# Patient Record
Sex: Male | Born: 1945
Health system: Southern US, Community
[De-identification: ages and names within clinical notes are randomized; demographics above are authoritative.]

---

## 2005-07-26 ENCOUNTER — Inpatient Hospital Stay (HOSPITAL_COMMUNITY): Admission: AD | Admit: 2005-07-26 | Discharge: 2005-08-03 | Payer: Self-pay | Admitting: Internal Medicine

## 2005-07-26 ENCOUNTER — Ambulatory Visit: Payer: Self-pay | Admitting: Internal Medicine

## 2005-09-21 ENCOUNTER — Encounter
Admission: RE | Admit: 2005-09-21 | Discharge: 2005-09-21 | Payer: Self-pay | Admitting: Thoracic Surgery (Cardiothoracic Vascular Surgery)

## 2007-02-11 IMAGING — CT CT CHEST W/O CM
1 of 3 series · 15 of 33 positions shown, 19 images · IV contrast (agent unspecified)
Comparison: none

CLINICAL DATA: Right subcarinal calcified density.  Patient for open heart surgery for unstable angina.  
 CHEST CT WITHOUT CONTRAST:
TECHNIQUE: Multidetector CT imaging of the chest was performed following the standard protocol without IV contrast.

[Series 2: routine chest w/o cm · axial · non-contrast · 0.86mm/px · z∈[-169,+121]mm · 15 of 64 slices shown, 19 images]
[im 3/64  mediastinal]
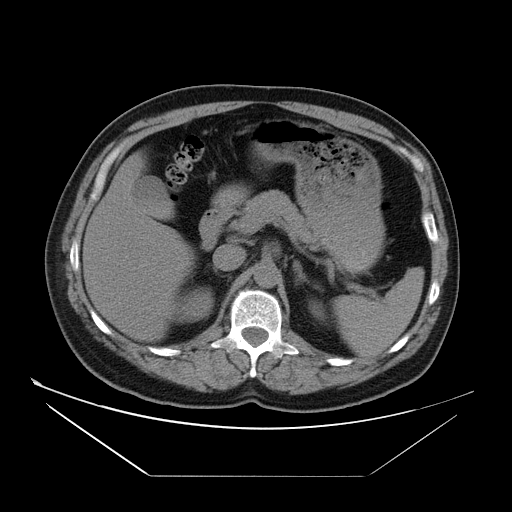
[im 3/64  lung]
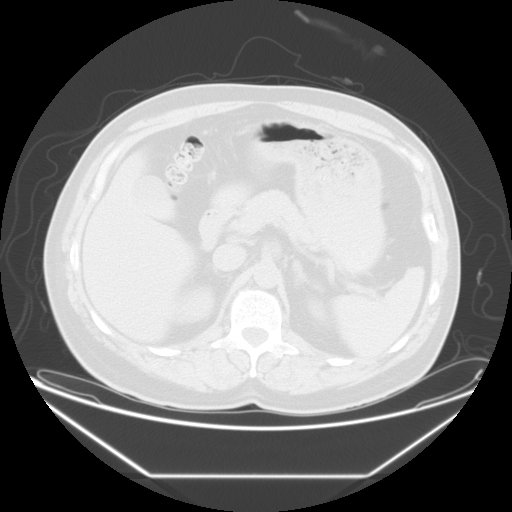
[im 8/64  lung]
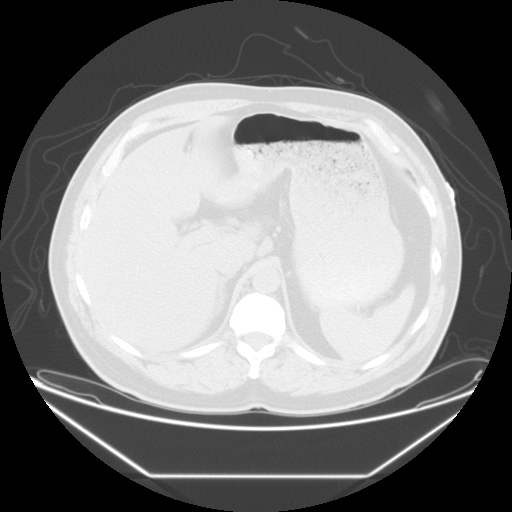
[im 12/64  lung]
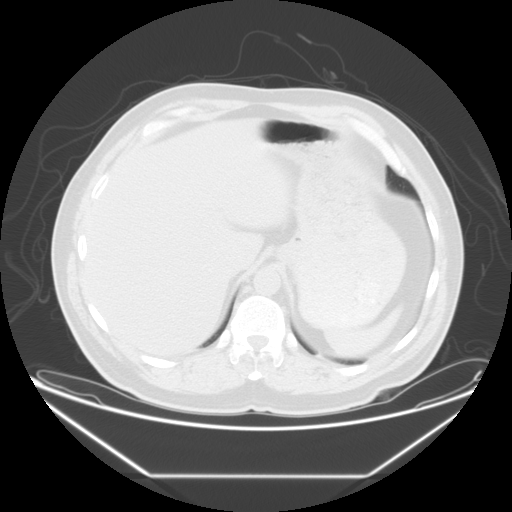
[im 17/64  lung]
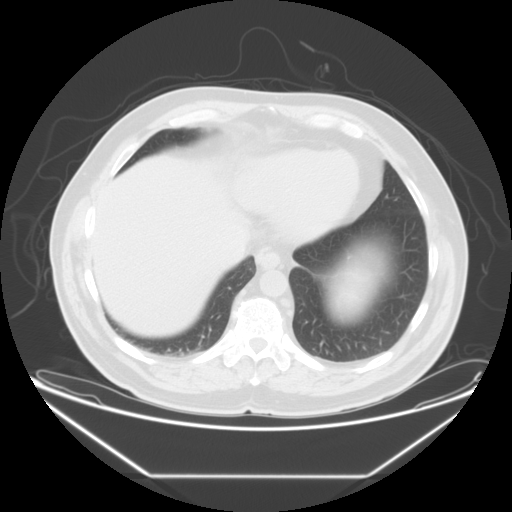
[im 19/64  mediastinal]
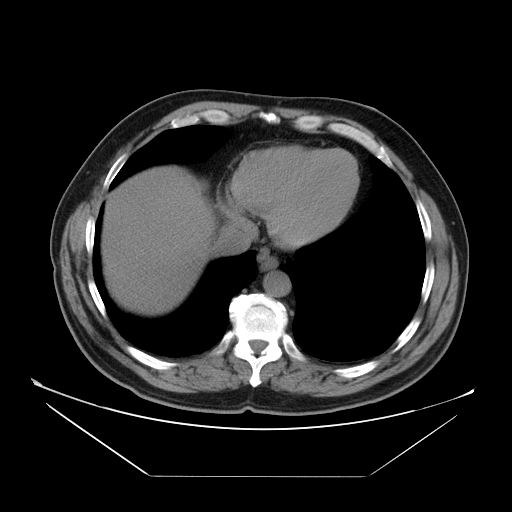
[im 19/64  lung]
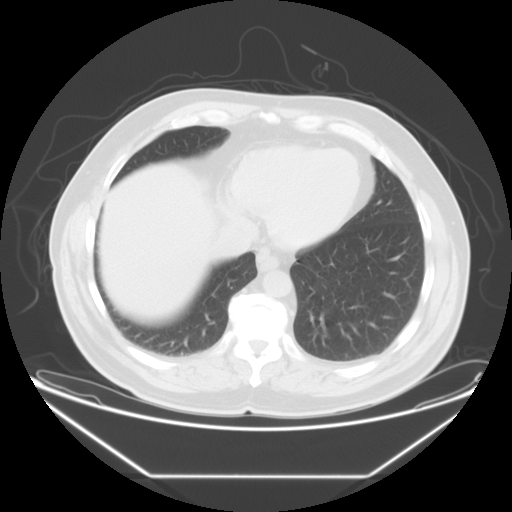
[im 24/64  lung]
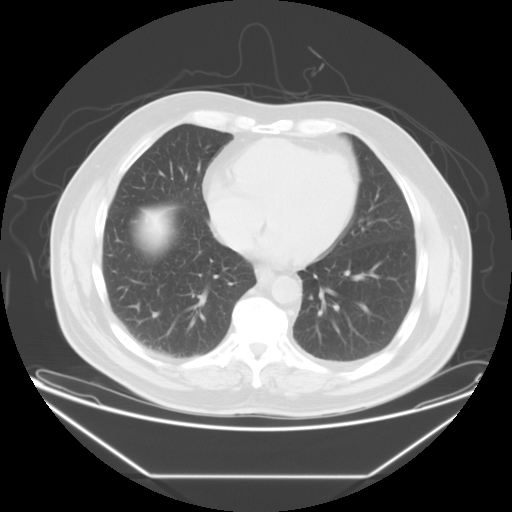
[im 29/64  lung]
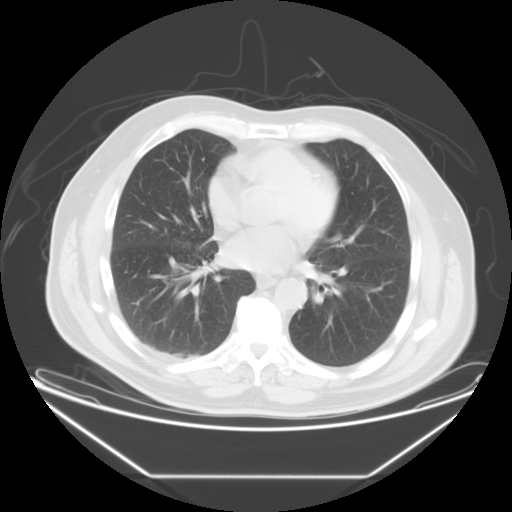
[im 33/64  lung]
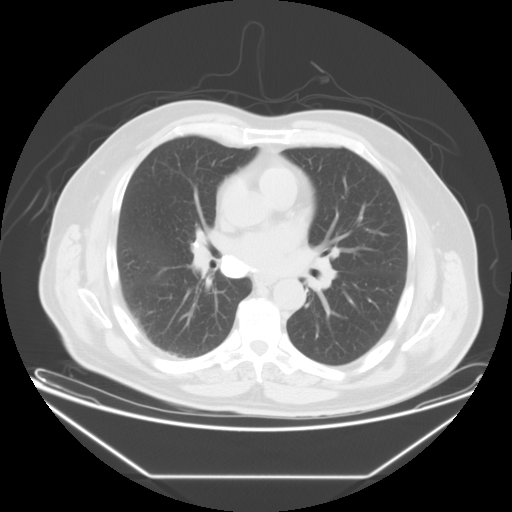
[im 36/64  mediastinal]
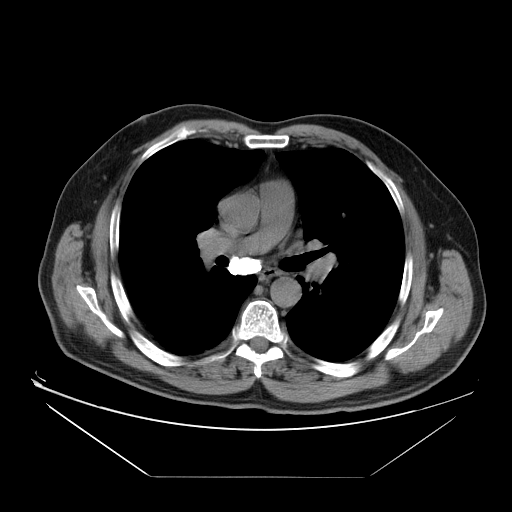
[im 36/64  lung]
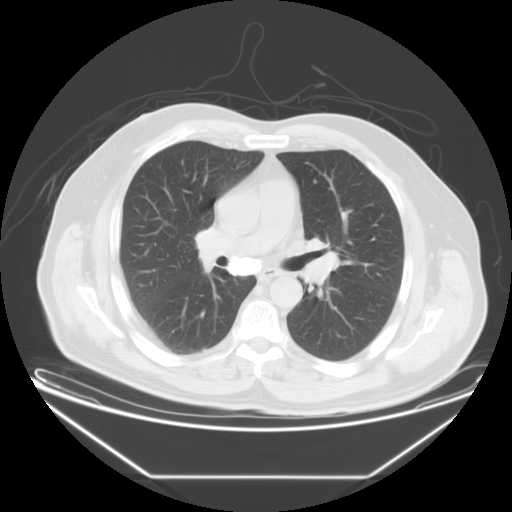
[im 40/64  lung]
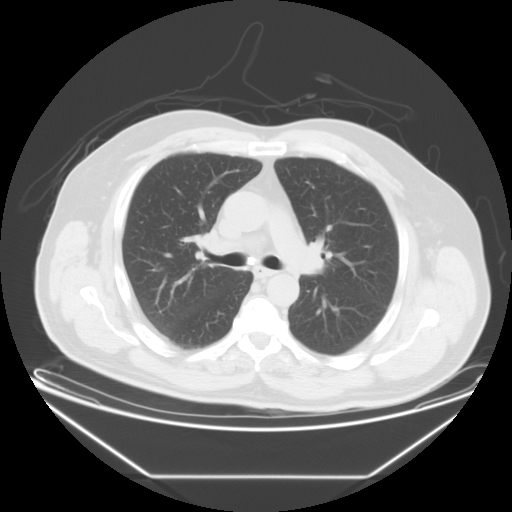
[im 45/64  lung]
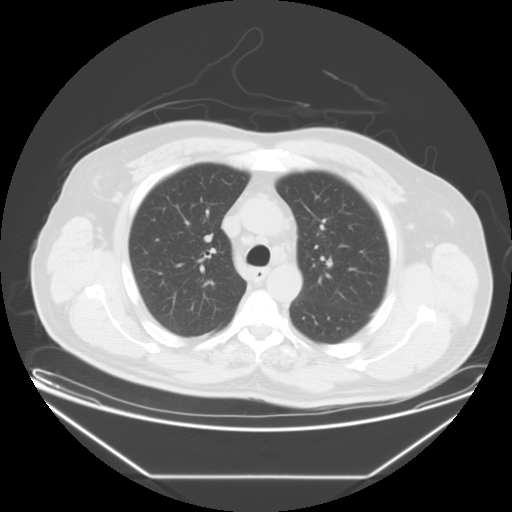
[im 47/64  lung]
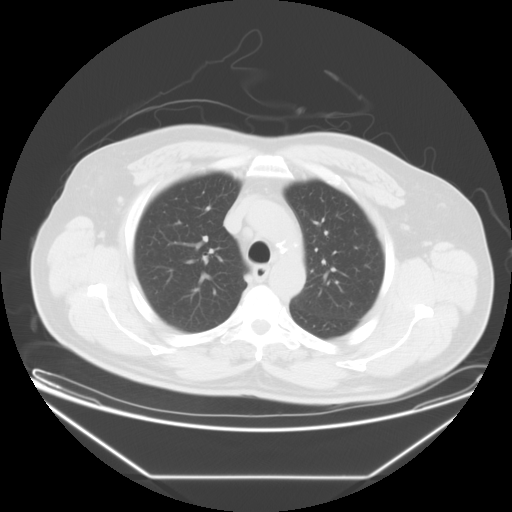
[im 52/64  mediastinal]
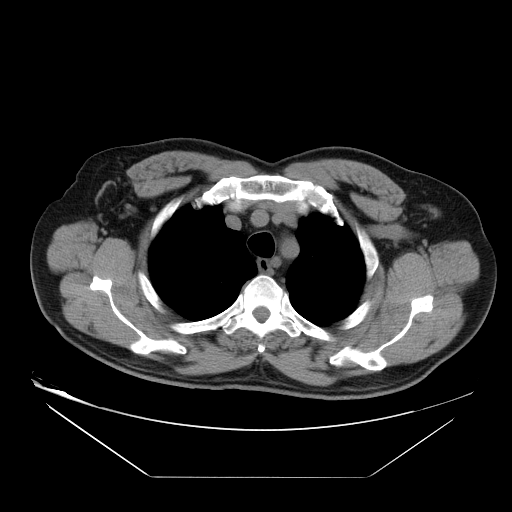
[im 52/64  lung]
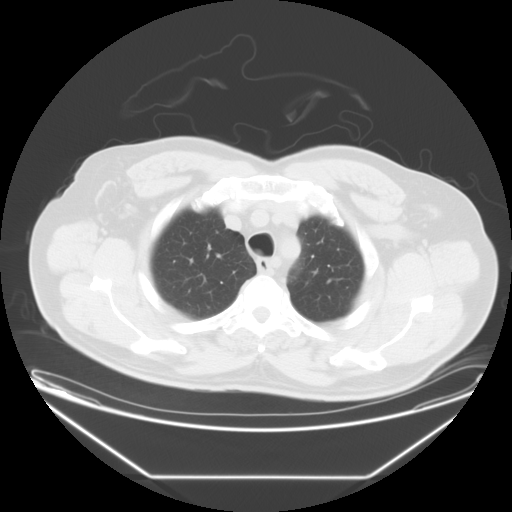
[im 57/64  lung]
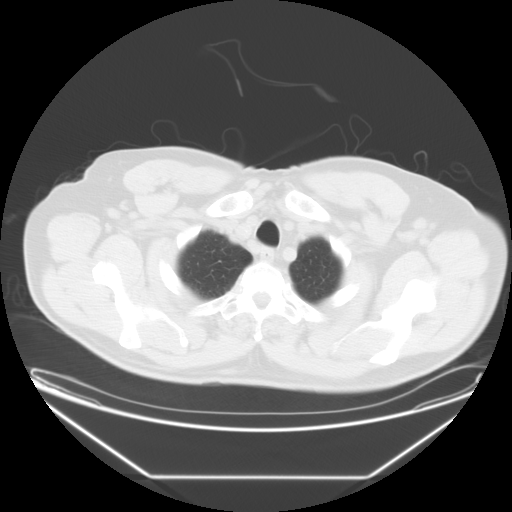
[im 61/64  lung]
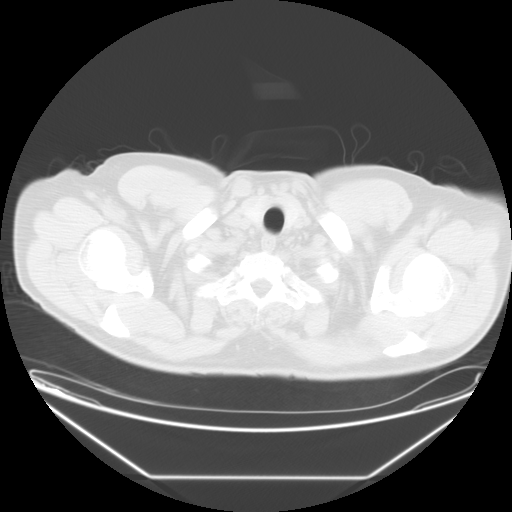

[15 of 33 positions shown; findings below may reference images not displayed]

FINDINGS: A series of scans of the entire chest are made without intravenous contrast and show a large group of calcified nodes in the right subcarinal region which measure up to 3.3 x 1.7 x estimated 5 cm and appear to be old calcified granulomata.  There are a few other very small calcifications in the mediastinal area.  No active pulmonary disease is identified.  Tracheobronchial tree is not compromised or narrowed.  The heart is normal in size but shows considerable coronary artery calcification.  There is no pericardial or pleural effusion.  No evidence of neoplastic mass is seen.  The adrenal glands and that portion of the liver that is visualized appear normal.  Bony thorax shows some anterior degenerative hypertrophic spurs but no fracture or metastatic disease.
IMPRESSION: Group of calcified nodes right subcarinal area.  No acute pulmonary disease is identified.  No evidence of neoplasm is seen.  Coronary arteries do show moderate calcification.

## 2007-02-16 IMAGING — CR DG CHEST 2V
2 series · 2 of 2 positions shown · non-contrast
Comparison: 08/01/05

CLINICAL DATA: Status post CABG procedure.  
 ESCNW-U VIEWS:

[w chest pa]
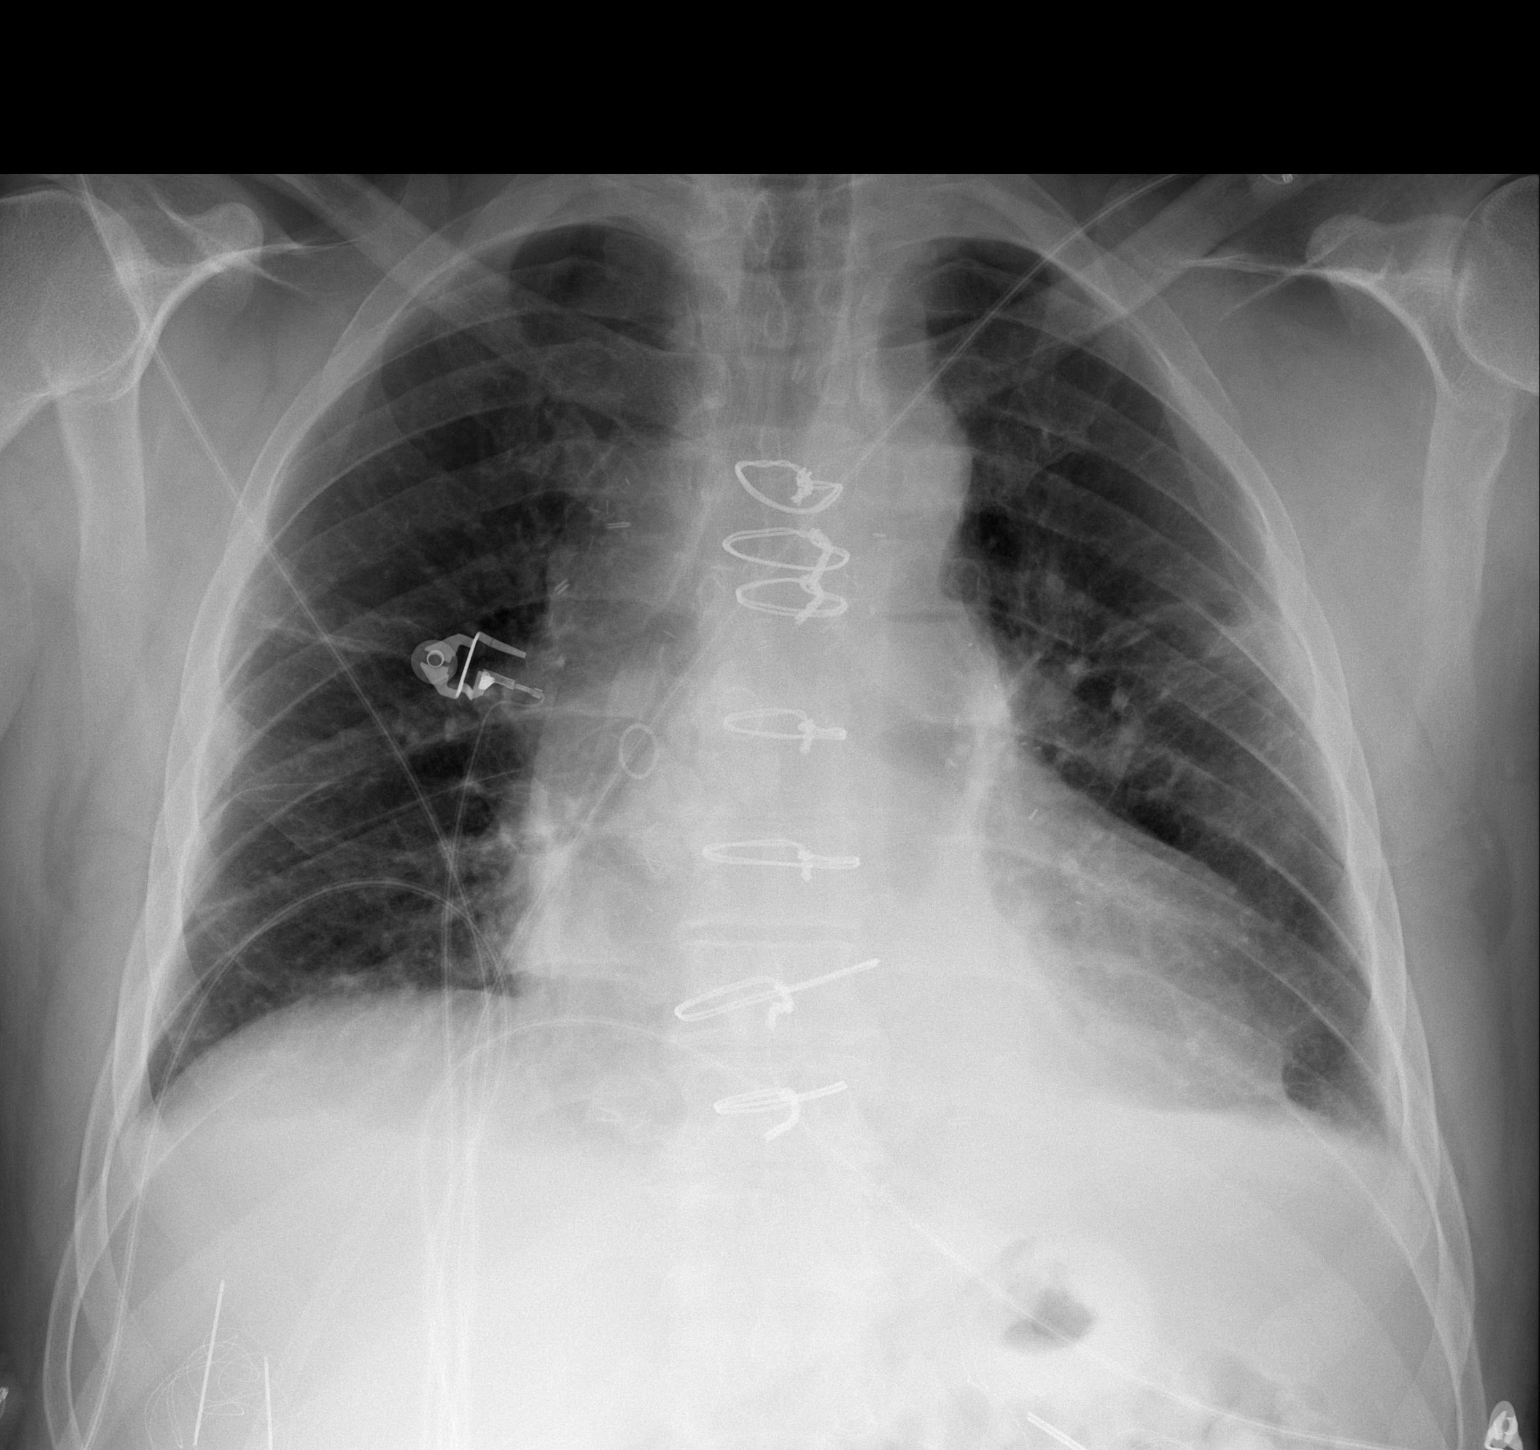

[w chest lat]
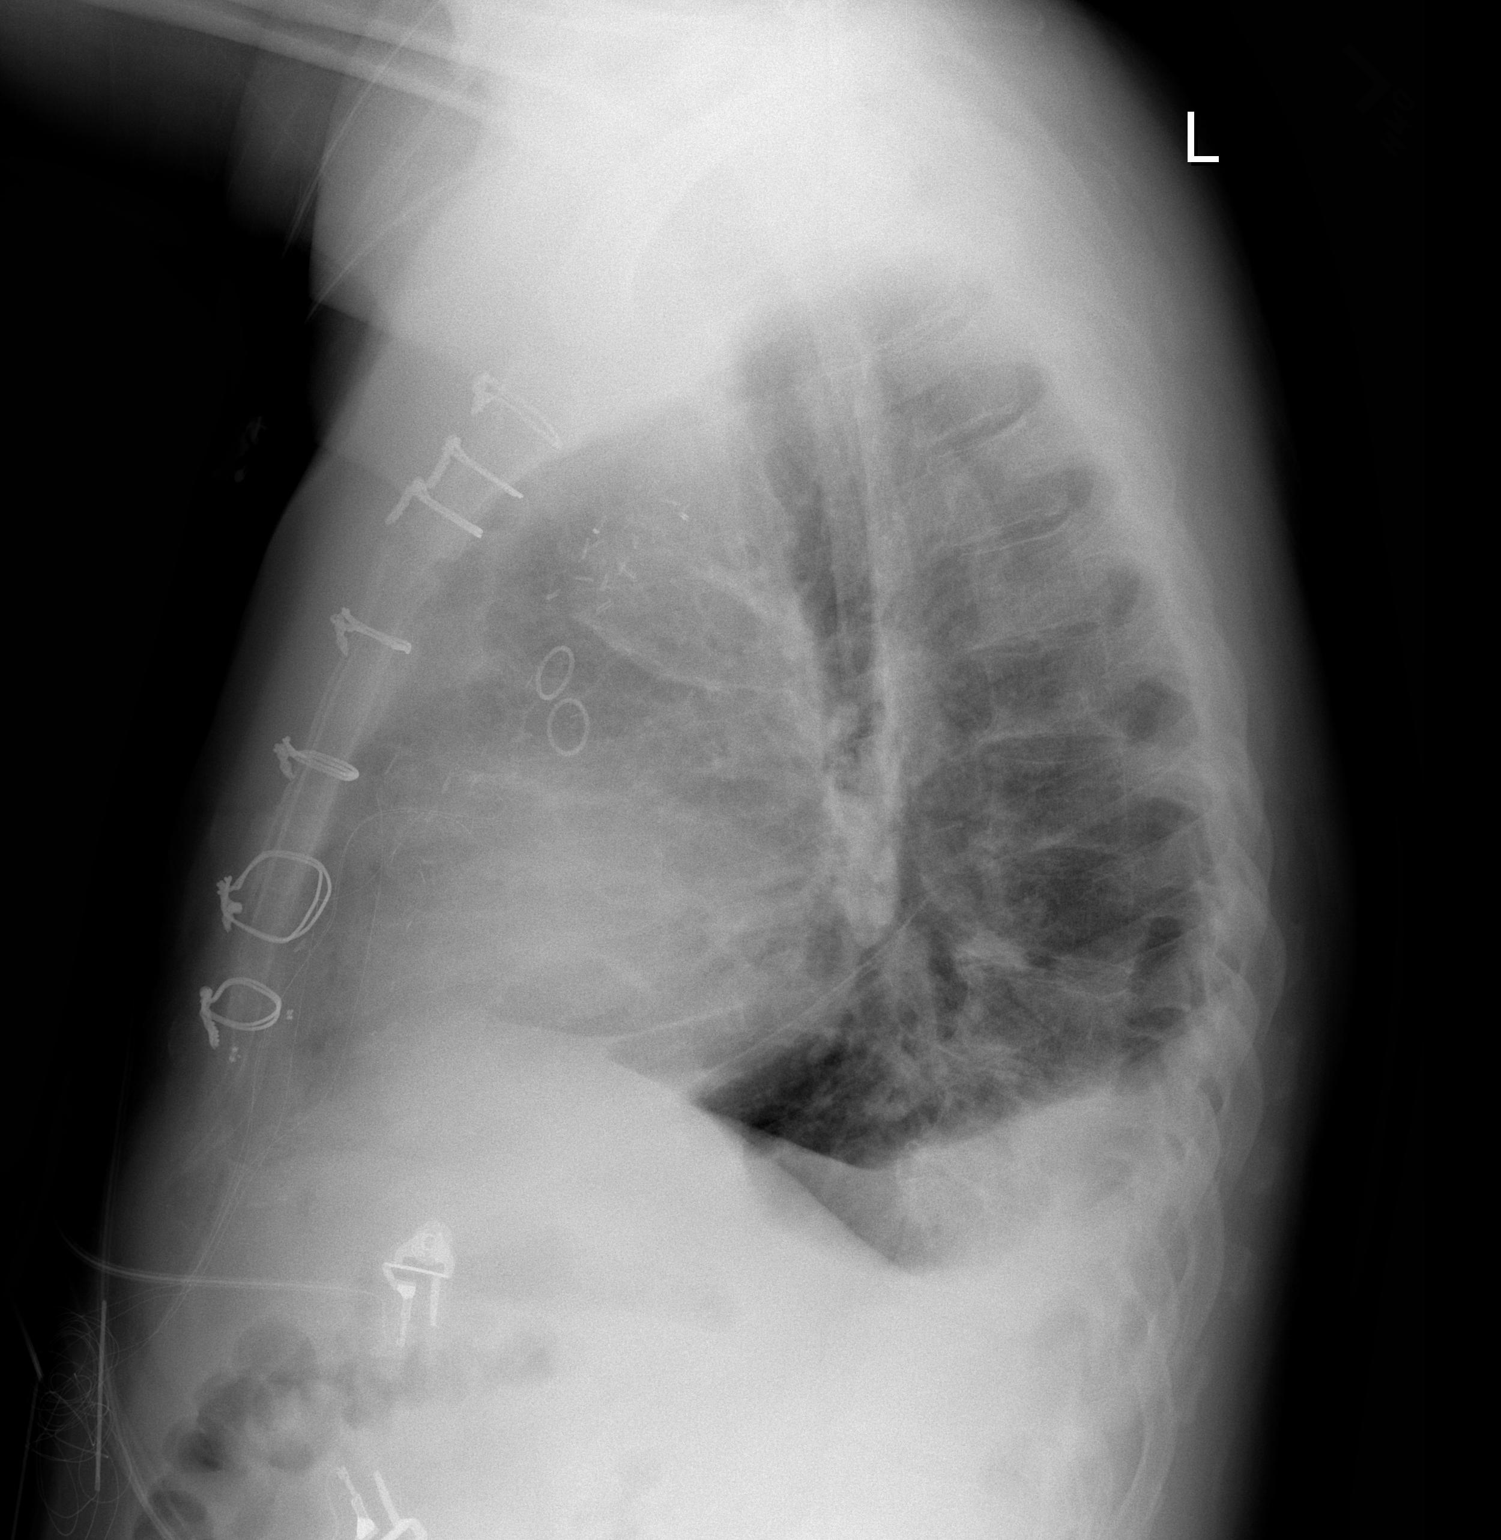

[2 of 2 positions shown; findings below may reference images not displayed]

FINDINGS: Interval removal of right IJ catheter.  Scattered areas of linear atelectasis seen bilaterally.  Bilateral pleural effusions left greater than right.  No interstitial edema.  There is cardiomegaly.
IMPRESSION: 1.  Cardiomegaly with bilateral effusions left greater than right.
 2.  Scattered areas of linear atelectasis.

## 2015-03-01 DIAGNOSIS — Z79899 Other long term (current) drug therapy: Secondary | ICD-10-CM | POA: Insufficient documentation

## 2015-03-04 DIAGNOSIS — E782 Mixed hyperlipidemia: Secondary | ICD-10-CM | POA: Diagnosis not present

## 2015-03-04 DIAGNOSIS — I10 Essential (primary) hypertension: Secondary | ICD-10-CM | POA: Diagnosis not present

## 2015-03-04 DIAGNOSIS — I2510 Atherosclerotic heart disease of native coronary artery without angina pectoris: Secondary | ICD-10-CM | POA: Diagnosis not present

## 2015-03-04 DIAGNOSIS — Z951 Presence of aortocoronary bypass graft: Secondary | ICD-10-CM | POA: Diagnosis not present

## 2015-03-05 DIAGNOSIS — Z23 Encounter for immunization: Secondary | ICD-10-CM | POA: Diagnosis not present

## 2015-07-26 DIAGNOSIS — H66009 Acute suppurative otitis media without spontaneous rupture of ear drum, unspecified ear: Secondary | ICD-10-CM | POA: Diagnosis not present

## 2015-11-08 DIAGNOSIS — H40013 Open angle with borderline findings, low risk, bilateral: Secondary | ICD-10-CM | POA: Diagnosis not present

## 2015-11-08 DIAGNOSIS — H25811 Combined forms of age-related cataract, right eye: Secondary | ICD-10-CM | POA: Diagnosis not present

## 2016-01-07 DIAGNOSIS — Z7982 Long term (current) use of aspirin: Secondary | ICD-10-CM | POA: Diagnosis not present

## 2016-01-07 DIAGNOSIS — E785 Hyperlipidemia, unspecified: Secondary | ICD-10-CM | POA: Diagnosis not present

## 2016-01-07 DIAGNOSIS — I10 Essential (primary) hypertension: Secondary | ICD-10-CM | POA: Diagnosis not present

## 2016-01-07 DIAGNOSIS — Z79899 Other long term (current) drug therapy: Secondary | ICD-10-CM | POA: Diagnosis not present

## 2016-01-07 DIAGNOSIS — Z951 Presence of aortocoronary bypass graft: Secondary | ICD-10-CM | POA: Diagnosis not present

## 2016-01-07 DIAGNOSIS — Z87891 Personal history of nicotine dependence: Secondary | ICD-10-CM | POA: Diagnosis not present

## 2016-01-07 DIAGNOSIS — I2510 Atherosclerotic heart disease of native coronary artery without angina pectoris: Secondary | ICD-10-CM | POA: Diagnosis not present

## 2016-01-07 DIAGNOSIS — H25811 Combined forms of age-related cataract, right eye: Secondary | ICD-10-CM | POA: Diagnosis not present

## 2016-01-07 DIAGNOSIS — I252 Old myocardial infarction: Secondary | ICD-10-CM | POA: Diagnosis not present

## 2016-02-11 DIAGNOSIS — E7800 Pure hypercholesterolemia, unspecified: Secondary | ICD-10-CM | POA: Diagnosis not present

## 2016-02-11 DIAGNOSIS — Z0000 Encounter for general adult medical examination without abnormal findings: Secondary | ICD-10-CM | POA: Diagnosis not present

## 2016-02-11 DIAGNOSIS — I10 Essential (primary) hypertension: Secondary | ICD-10-CM | POA: Diagnosis not present

## 2016-02-20 DIAGNOSIS — M545 Low back pain: Secondary | ICD-10-CM | POA: Diagnosis not present

## 2016-02-25 DIAGNOSIS — M1712 Unilateral primary osteoarthritis, left knee: Secondary | ICD-10-CM | POA: Diagnosis not present

## 2016-02-26 DIAGNOSIS — R69 Illness, unspecified: Secondary | ICD-10-CM | POA: Diagnosis not present

## 2016-02-28 DIAGNOSIS — M19012 Primary osteoarthritis, left shoulder: Secondary | ICD-10-CM | POA: Diagnosis not present

## 2016-03-06 DIAGNOSIS — E782 Mixed hyperlipidemia: Secondary | ICD-10-CM | POA: Diagnosis not present

## 2016-03-06 DIAGNOSIS — Z951 Presence of aortocoronary bypass graft: Secondary | ICD-10-CM | POA: Diagnosis not present

## 2016-03-06 DIAGNOSIS — I10 Essential (primary) hypertension: Secondary | ICD-10-CM | POA: Diagnosis not present

## 2016-03-06 DIAGNOSIS — I2510 Atherosclerotic heart disease of native coronary artery without angina pectoris: Secondary | ICD-10-CM | POA: Diagnosis not present

## 2016-03-31 DIAGNOSIS — Z1211 Encounter for screening for malignant neoplasm of colon: Secondary | ICD-10-CM | POA: Diagnosis not present

## 2016-04-08 DIAGNOSIS — G609 Hereditary and idiopathic neuropathy, unspecified: Secondary | ICD-10-CM | POA: Diagnosis not present

## 2016-04-08 DIAGNOSIS — L853 Xerosis cutis: Secondary | ICD-10-CM | POA: Diagnosis not present

## 2016-04-08 DIAGNOSIS — Z9181 History of falling: Secondary | ICD-10-CM | POA: Diagnosis not present

## 2016-04-08 DIAGNOSIS — Z0000 Encounter for general adult medical examination without abnormal findings: Secondary | ICD-10-CM | POA: Diagnosis not present

## 2016-04-08 DIAGNOSIS — E785 Hyperlipidemia, unspecified: Secondary | ICD-10-CM | POA: Diagnosis not present

## 2016-04-08 DIAGNOSIS — Z79899 Other long term (current) drug therapy: Secondary | ICD-10-CM | POA: Diagnosis not present

## 2016-05-26 DIAGNOSIS — J988 Other specified respiratory disorders: Secondary | ICD-10-CM | POA: Diagnosis not present

## 2016-05-26 DIAGNOSIS — J329 Chronic sinusitis, unspecified: Secondary | ICD-10-CM | POA: Diagnosis not present

## 2016-06-10 ENCOUNTER — Ambulatory Visit (INDEPENDENT_AMBULATORY_CARE_PROVIDER_SITE_OTHER): Payer: Medicare HMO | Admitting: Sports Medicine

## 2016-06-10 ENCOUNTER — Encounter: Payer: Self-pay | Admitting: Sports Medicine

## 2016-06-10 ENCOUNTER — Ambulatory Visit (INDEPENDENT_AMBULATORY_CARE_PROVIDER_SITE_OTHER): Payer: Medicare HMO

## 2016-06-10 DIAGNOSIS — M79671 Pain in right foot: Secondary | ICD-10-CM

## 2016-06-10 DIAGNOSIS — M79672 Pain in left foot: Secondary | ICD-10-CM

## 2016-06-10 DIAGNOSIS — I10 Essential (primary) hypertension: Secondary | ICD-10-CM | POA: Insufficient documentation

## 2016-06-10 DIAGNOSIS — R001 Bradycardia, unspecified: Secondary | ICD-10-CM | POA: Insufficient documentation

## 2016-06-10 DIAGNOSIS — G629 Polyneuropathy, unspecified: Secondary | ICD-10-CM | POA: Diagnosis not present

## 2016-06-10 DIAGNOSIS — I2510 Atherosclerotic heart disease of native coronary artery without angina pectoris: Secondary | ICD-10-CM | POA: Insufficient documentation

## 2016-06-10 DIAGNOSIS — E785 Hyperlipidemia, unspecified: Secondary | ICD-10-CM | POA: Insufficient documentation

## 2016-06-10 NOTE — Progress Notes (Addendum)
Subjective: Logan Johnson is a 71 y.o. male patient who presents to office for evaluation of bilateral foot pain/toe numbness. Patient started on Gabapentin 300mg  QHS x 1 month with some improvement however sometimes still at end of day bothersome. Patient denies any other pedal complaints. Denies injury/trip/fall/sprain/any causative factors.   No radiculopathy  Patient Active Problem List   Diagnosis Date Noted  . Coronary atherosclerosis of native coronary artery 06/10/2016  . Essential hypertension, benign 06/10/2016  . Hyperlipemia 06/10/2016  . Persistent sinus bradycardia 06/10/2016  . High risk medication use 03/01/2015    No current outpatient prescriptions on file prior to visit.   No current facility-administered medications on file prior to visit.     No Known Allergies  Objective:  General: Alert and oriented x3 in no acute distress  Dermatology: No open lesions bilateral lower extremities, no webspace macerations, no ecchymosis bilateral, all nails x 10 are well manicured.  Vascular: Dorsalis Pedis and Posterior Tibial pedal pulses palpable, Capillary Fill Time 3 seconds,(+) pedal hair growth bilateral, no edema bilateral lower extremities, Temperature gradient within normal limits.  Neurology: Gross sensation intact via light touch bilateral, Protective sensation intact with Phoebe PerchSemmes Weinstein Monofilament to all pedal sites, Vibratory diminished bilateral, No babinski sign present bilateral. (- )Tinels sign bilateral.   Musculoskeletal:No tenderness to palpation, subjective numbness to all toes on both feet,No pain with calf compression bilateral. No symptomatic boney prominences. Strength within normal limits in all groups bilateral.   Xrays right/left- no acute findings  Assessment and Plan: Problem List Items Addressed This Visit    None    Visit Diagnoses    Neuropathy (HCC)    -  Primary   Pain in both feet       Relevant Orders   DG Foot 2 Views Left    DG Foot 2 Views Right       -Complete examination performed -Discussed treatement options for neuropathy -Recommend good supportive shoes and inserts -Patient to continue with Gabapentin as give by PCP  -Patient to return to office as needed or sooner if condition worsens.  Asencion Islamitorya Deyona Soza, DPM

## 2016-12-31 DIAGNOSIS — Z125 Encounter for screening for malignant neoplasm of prostate: Secondary | ICD-10-CM | POA: Diagnosis not present

## 2016-12-31 DIAGNOSIS — E785 Hyperlipidemia, unspecified: Secondary | ICD-10-CM | POA: Diagnosis not present

## 2016-12-31 DIAGNOSIS — Z1159 Encounter for screening for other viral diseases: Secondary | ICD-10-CM | POA: Diagnosis not present

## 2016-12-31 DIAGNOSIS — Z6827 Body mass index (BMI) 27.0-27.9, adult: Secondary | ICD-10-CM | POA: Diagnosis not present

## 2016-12-31 DIAGNOSIS — Z0000 Encounter for general adult medical examination without abnormal findings: Secondary | ICD-10-CM | POA: Diagnosis not present

## 2017-01-12 DIAGNOSIS — Z0100 Encounter for examination of eyes and vision without abnormal findings: Secondary | ICD-10-CM | POA: Diagnosis not present

## 2017-01-31 DIAGNOSIS — Z1211 Encounter for screening for malignant neoplasm of colon: Secondary | ICD-10-CM | POA: Diagnosis not present

## 2017-03-02 DIAGNOSIS — L299 Pruritus, unspecified: Secondary | ICD-10-CM | POA: Diagnosis not present

## 2017-03-02 DIAGNOSIS — L853 Xerosis cutis: Secondary | ICD-10-CM | POA: Diagnosis not present

## 2017-03-02 DIAGNOSIS — L300 Nummular dermatitis: Secondary | ICD-10-CM | POA: Diagnosis not present

## 2017-03-22 DIAGNOSIS — R69 Illness, unspecified: Secondary | ICD-10-CM | POA: Diagnosis not present

## 2017-04-09 DIAGNOSIS — Z951 Presence of aortocoronary bypass graft: Secondary | ICD-10-CM | POA: Diagnosis not present

## 2017-04-09 DIAGNOSIS — E785 Hyperlipidemia, unspecified: Secondary | ICD-10-CM | POA: Diagnosis not present

## 2017-04-09 DIAGNOSIS — I2510 Atherosclerotic heart disease of native coronary artery without angina pectoris: Secondary | ICD-10-CM | POA: Diagnosis not present

## 2017-04-09 DIAGNOSIS — I10 Essential (primary) hypertension: Secondary | ICD-10-CM | POA: Diagnosis not present

## 2017-12-10 DIAGNOSIS — Z6828 Body mass index (BMI) 28.0-28.9, adult: Secondary | ICD-10-CM | POA: Diagnosis not present

## 2017-12-10 DIAGNOSIS — I119 Hypertensive heart disease without heart failure: Secondary | ICD-10-CM | POA: Diagnosis not present

## 2017-12-10 DIAGNOSIS — Z0000 Encounter for general adult medical examination without abnormal findings: Secondary | ICD-10-CM | POA: Diagnosis not present

## 2017-12-10 DIAGNOSIS — E785 Hyperlipidemia, unspecified: Secondary | ICD-10-CM | POA: Diagnosis not present

## 2017-12-10 DIAGNOSIS — Z1331 Encounter for screening for depression: Secondary | ICD-10-CM | POA: Diagnosis not present

## 2017-12-10 DIAGNOSIS — Z79899 Other long term (current) drug therapy: Secondary | ICD-10-CM | POA: Diagnosis not present

## 2017-12-10 DIAGNOSIS — L853 Xerosis cutis: Secondary | ICD-10-CM | POA: Diagnosis not present

## 2017-12-10 DIAGNOSIS — Z9181 History of falling: Secondary | ICD-10-CM | POA: Diagnosis not present

## 2017-12-10 DIAGNOSIS — G609 Hereditary and idiopathic neuropathy, unspecified: Secondary | ICD-10-CM | POA: Diagnosis not present

## 2017-12-10 DIAGNOSIS — Z1339 Encounter for screening examination for other mental health and behavioral disorders: Secondary | ICD-10-CM | POA: Diagnosis not present

## 2017-12-17 DIAGNOSIS — Z951 Presence of aortocoronary bypass graft: Secondary | ICD-10-CM | POA: Diagnosis not present

## 2017-12-17 DIAGNOSIS — I2510 Atherosclerotic heart disease of native coronary artery without angina pectoris: Secondary | ICD-10-CM | POA: Diagnosis not present

## 2017-12-17 DIAGNOSIS — I10 Essential (primary) hypertension: Secondary | ICD-10-CM | POA: Diagnosis not present

## 2018-03-24 DIAGNOSIS — R69 Illness, unspecified: Secondary | ICD-10-CM | POA: Diagnosis not present

## 2018-06-24 DIAGNOSIS — Z951 Presence of aortocoronary bypass graft: Secondary | ICD-10-CM | POA: Diagnosis not present

## 2018-06-24 DIAGNOSIS — I10 Essential (primary) hypertension: Secondary | ICD-10-CM | POA: Diagnosis not present

## 2018-06-24 DIAGNOSIS — I2510 Atherosclerotic heart disease of native coronary artery without angina pectoris: Secondary | ICD-10-CM | POA: Diagnosis not present

## 2018-06-24 DIAGNOSIS — E785 Hyperlipidemia, unspecified: Secondary | ICD-10-CM | POA: Diagnosis not present

## 2018-08-22 DIAGNOSIS — Z6827 Body mass index (BMI) 27.0-27.9, adult: Secondary | ICD-10-CM | POA: Diagnosis not present

## 2018-08-22 DIAGNOSIS — J45909 Unspecified asthma, uncomplicated: Secondary | ICD-10-CM | POA: Diagnosis not present

## 2018-12-02 DIAGNOSIS — H2703 Aphakia, bilateral: Secondary | ICD-10-CM | POA: Diagnosis not present

## 2019-01-20 DIAGNOSIS — I10 Essential (primary) hypertension: Secondary | ICD-10-CM | POA: Diagnosis not present

## 2019-01-20 DIAGNOSIS — E785 Hyperlipidemia, unspecified: Secondary | ICD-10-CM | POA: Diagnosis not present

## 2019-01-20 DIAGNOSIS — I2510 Atherosclerotic heart disease of native coronary artery without angina pectoris: Secondary | ICD-10-CM | POA: Diagnosis not present

## 2019-01-20 DIAGNOSIS — Z951 Presence of aortocoronary bypass graft: Secondary | ICD-10-CM | POA: Diagnosis not present

## 2019-03-06 DIAGNOSIS — R69 Illness, unspecified: Secondary | ICD-10-CM | POA: Diagnosis not present

## 2019-06-30 DIAGNOSIS — R509 Fever, unspecified: Secondary | ICD-10-CM | POA: Diagnosis not present

## 2019-06-30 DIAGNOSIS — Z20828 Contact with and (suspected) exposure to other viral communicable diseases: Secondary | ICD-10-CM | POA: Diagnosis not present

## 2019-07-05 DIAGNOSIS — R0789 Other chest pain: Secondary | ICD-10-CM | POA: Diagnosis not present

## 2019-07-05 DIAGNOSIS — R0602 Shortness of breath: Secondary | ICD-10-CM | POA: Diagnosis not present

## 2019-07-05 DIAGNOSIS — U071 COVID-19: Secondary | ICD-10-CM | POA: Diagnosis not present

## 2019-07-05 DIAGNOSIS — R079 Chest pain, unspecified: Secondary | ICD-10-CM | POA: Diagnosis not present

## 2019-07-05 DIAGNOSIS — J1282 Pneumonia due to coronavirus disease 2019: Secondary | ICD-10-CM | POA: Diagnosis not present

## 2019-07-17 DIAGNOSIS — Z6827 Body mass index (BMI) 27.0-27.9, adult: Secondary | ICD-10-CM | POA: Diagnosis not present

## 2019-07-17 DIAGNOSIS — R0600 Dyspnea, unspecified: Secondary | ICD-10-CM | POA: Diagnosis not present

## 2019-07-17 DIAGNOSIS — Z0000 Encounter for general adult medical examination without abnormal findings: Secondary | ICD-10-CM | POA: Diagnosis not present

## 2019-07-17 DIAGNOSIS — Z1331 Encounter for screening for depression: Secondary | ICD-10-CM | POA: Diagnosis not present

## 2019-08-02 DIAGNOSIS — R9389 Abnormal findings on diagnostic imaging of other specified body structures: Secondary | ICD-10-CM | POA: Diagnosis not present

## 2019-08-02 DIAGNOSIS — Z6827 Body mass index (BMI) 27.0-27.9, adult: Secondary | ICD-10-CM | POA: Diagnosis not present

## 2019-08-02 DIAGNOSIS — Z1331 Encounter for screening for depression: Secondary | ICD-10-CM | POA: Diagnosis not present

## 2019-08-30 DIAGNOSIS — I10 Essential (primary) hypertension: Secondary | ICD-10-CM | POA: Diagnosis not present

## 2019-08-30 DIAGNOSIS — R0609 Other forms of dyspnea: Secondary | ICD-10-CM | POA: Insufficient documentation

## 2019-08-30 DIAGNOSIS — E785 Hyperlipidemia, unspecified: Secondary | ICD-10-CM | POA: Diagnosis not present

## 2019-08-30 DIAGNOSIS — R0600 Dyspnea, unspecified: Secondary | ICD-10-CM | POA: Insufficient documentation

## 2019-08-30 DIAGNOSIS — Z951 Presence of aortocoronary bypass graft: Secondary | ICD-10-CM | POA: Diagnosis not present

## 2019-08-30 DIAGNOSIS — I2510 Atherosclerotic heart disease of native coronary artery without angina pectoris: Secondary | ICD-10-CM | POA: Diagnosis not present

## 2019-09-07 DIAGNOSIS — R0600 Dyspnea, unspecified: Secondary | ICD-10-CM | POA: Diagnosis not present

## 2019-09-07 DIAGNOSIS — I2510 Atherosclerotic heart disease of native coronary artery without angina pectoris: Secondary | ICD-10-CM | POA: Diagnosis not present

## 2020-02-12 DIAGNOSIS — I119 Hypertensive heart disease without heart failure: Secondary | ICD-10-CM | POA: Diagnosis not present

## 2020-02-12 DIAGNOSIS — Z6827 Body mass index (BMI) 27.0-27.9, adult: Secondary | ICD-10-CM | POA: Diagnosis not present

## 2020-02-12 DIAGNOSIS — Z79899 Other long term (current) drug therapy: Secondary | ICD-10-CM | POA: Diagnosis not present

## 2020-02-12 DIAGNOSIS — E7800 Pure hypercholesterolemia, unspecified: Secondary | ICD-10-CM | POA: Diagnosis not present

## 2020-03-08 DIAGNOSIS — I10 Essential (primary) hypertension: Secondary | ICD-10-CM | POA: Diagnosis not present

## 2020-03-08 DIAGNOSIS — Z951 Presence of aortocoronary bypass graft: Secondary | ICD-10-CM | POA: Diagnosis not present

## 2020-03-08 DIAGNOSIS — E785 Hyperlipidemia, unspecified: Secondary | ICD-10-CM | POA: Diagnosis not present

## 2020-03-08 DIAGNOSIS — I2510 Atherosclerotic heart disease of native coronary artery without angina pectoris: Secondary | ICD-10-CM | POA: Diagnosis not present

## 2020-03-13 DIAGNOSIS — Z23 Encounter for immunization: Secondary | ICD-10-CM | POA: Diagnosis not present

## 2020-03-13 DIAGNOSIS — I119 Hypertensive heart disease without heart failure: Secondary | ICD-10-CM | POA: Diagnosis not present

## 2020-03-13 DIAGNOSIS — Z6827 Body mass index (BMI) 27.0-27.9, adult: Secondary | ICD-10-CM | POA: Diagnosis not present

## 2020-08-07 DIAGNOSIS — G629 Polyneuropathy, unspecified: Secondary | ICD-10-CM | POA: Diagnosis not present

## 2020-08-07 DIAGNOSIS — R0781 Pleurodynia: Secondary | ICD-10-CM | POA: Diagnosis not present

## 2020-08-07 DIAGNOSIS — Z0000 Encounter for general adult medical examination without abnormal findings: Secondary | ICD-10-CM | POA: Diagnosis not present

## 2020-08-07 DIAGNOSIS — E7800 Pure hypercholesterolemia, unspecified: Secondary | ICD-10-CM | POA: Diagnosis not present

## 2020-08-07 DIAGNOSIS — Z6828 Body mass index (BMI) 28.0-28.9, adult: Secondary | ICD-10-CM | POA: Diagnosis not present

## 2020-08-07 DIAGNOSIS — Z79899 Other long term (current) drug therapy: Secondary | ICD-10-CM | POA: Diagnosis not present

## 2020-08-07 DIAGNOSIS — Z1331 Encounter for screening for depression: Secondary | ICD-10-CM | POA: Diagnosis not present

## 2020-08-07 DIAGNOSIS — I119 Hypertensive heart disease without heart failure: Secondary | ICD-10-CM | POA: Diagnosis not present

## 2020-09-09 DIAGNOSIS — G8929 Other chronic pain: Secondary | ICD-10-CM | POA: Insufficient documentation

## 2020-09-09 DIAGNOSIS — E785 Hyperlipidemia, unspecified: Secondary | ICD-10-CM | POA: Diagnosis not present

## 2020-09-09 DIAGNOSIS — Z951 Presence of aortocoronary bypass graft: Secondary | ICD-10-CM | POA: Diagnosis not present

## 2020-09-09 DIAGNOSIS — I2510 Atherosclerotic heart disease of native coronary artery without angina pectoris: Secondary | ICD-10-CM | POA: Diagnosis not present

## 2020-09-09 DIAGNOSIS — I10 Essential (primary) hypertension: Secondary | ICD-10-CM | POA: Diagnosis not present

## 2020-09-09 DIAGNOSIS — R0600 Dyspnea, unspecified: Secondary | ICD-10-CM | POA: Diagnosis not present

## 2020-09-09 DIAGNOSIS — M545 Low back pain, unspecified: Secondary | ICD-10-CM | POA: Diagnosis not present

## 2020-11-19 ENCOUNTER — Other Ambulatory Visit: Payer: Self-pay

## 2020-11-19 ENCOUNTER — Ambulatory Visit: Payer: Medicare HMO | Admitting: Sports Medicine

## 2020-11-19 ENCOUNTER — Ambulatory Visit (INDEPENDENT_AMBULATORY_CARE_PROVIDER_SITE_OTHER): Payer: Medicare HMO

## 2020-11-19 ENCOUNTER — Encounter: Payer: Self-pay | Admitting: Sports Medicine

## 2020-11-19 DIAGNOSIS — M21621 Bunionette of right foot: Secondary | ICD-10-CM

## 2020-11-19 DIAGNOSIS — M79671 Pain in right foot: Secondary | ICD-10-CM | POA: Diagnosis not present

## 2020-11-19 DIAGNOSIS — J1282 Pneumonia due to coronavirus disease 2019: Secondary | ICD-10-CM | POA: Insufficient documentation

## 2020-11-19 DIAGNOSIS — M779 Enthesopathy, unspecified: Secondary | ICD-10-CM

## 2020-11-19 DIAGNOSIS — L853 Xerosis cutis: Secondary | ICD-10-CM | POA: Diagnosis not present

## 2020-11-19 DIAGNOSIS — R0789 Other chest pain: Secondary | ICD-10-CM | POA: Insufficient documentation

## 2020-11-19 DIAGNOSIS — U071 COVID-19: Secondary | ICD-10-CM | POA: Insufficient documentation

## 2020-11-19 MED ORDER — TRIAMCINOLONE ACETONIDE 10 MG/ML IJ SUSP
10.0000 mg | Freq: Once | INTRAMUSCULAR | Status: AC
  Administered 2020-11-19: 10 mg

## 2020-11-19 NOTE — Progress Notes (Signed)
Subjective: Logan Johnson is a 75 y.o. male patient who presents to office for evaluation of right foot pain reports that pain is relieved with certain shoes and resting has noticed that when he is doing a lot of walking and standing there is some rubbing and inflammation at the baby toe joint states that the pain makes him walk funny and has issues where there is pain that radiates up the leg due to the way that he is walking.  Patient has not tried any treatment besides changing his shoes and resting which gives some relief but the more he walks and stands it tends to flareup, becoming more bothersome.  Patient denies any other pedal complaints at this time.  Review of system noncontributory.  Patient Active Problem List   Diagnosis Date Noted   Atypical chest pain 11/19/2020   COVID-19 11/19/2020   Pneumonia due to COVID-19 virus 11/19/2020   Chronic midline low back pain without sciatica 09/09/2020   DOE (dyspnea on exertion) 08/30/2019   Dyslipidemia 04/09/2017   Hx of CABG 04/09/2017   Coronary atherosclerosis of native coronary artery 06/10/2016   Essential hypertension, benign 06/10/2016   Hyperlipemia 06/10/2016   Persistent sinus bradycardia 06/10/2016   High risk medication use 03/01/2015    Current Outpatient Medications on File Prior to Visit  Medication Sig Dispense Refill   amLODipine (NORVASC) 5 MG tablet TAKE 1 TABLET BY MOUTH AT BEDTIME FOR BLOOD PRESSURE     aspirin EC 81 MG tablet Take 81 mg by mouth.     atorvastatin (LIPITOR) 40 MG tablet TAKE ONE TABLET BY MOUTH ONCE DAILY     B Complex Vitamins (VITAMIN B COMPLEX PO) Take by mouth.     cyanocobalamin 1000 MCG tablet Take by mouth.     DOCOSAHEXAENOIC ACID PO Take 120 mg by mouth.     gabapentin (NEURONTIN) 300 MG capsule Take 300 mg by mouth 3 (three) times daily.     lisinopril-hydrochlorothiazide (PRINZIDE,ZESTORETIC) 10-12.5 MG tablet TAKE ONE TABLET BY MOUTH ONCE DAILY     loratadine (CLARITIN) 10 MG  tablet Take 10 mg by mouth.     Omega-3 Fatty Acids (FISH OIL PO) Take by mouth.     No current facility-administered medications on file prior to visit.    No Known Allergies  Objective:  General: Alert and oriented x3 in no acute distress  Dermatology: No open lesions bilateral lower extremities, no webspace macerations, no ecchymosis bilateral, all nails x 10 are well manicured.  Dry skin plantar surfaces bilateral.  Vascular: Dorsalis Pedis and Posterior Tibial pedal pulses palpable, Capillary Fill Time 3 seconds,(+) pedal hair growth bilateral, no edema bilateral lower extremities, Temperature gradient within normal limits.  Neurology: Michaell Cowing sensation intact via light touch bilateral.  Unchanged neuropathy symptoms bilateral.  Musculoskeletal: Mild tenderness with palpation at fifth metatarsophalangeal joint of the right foot with early tailor's bunion deformity noted. Strength within normal limits in all groups bilateral.  Limited joint range of motion history of arthritis.  Gait: Antalgic gait  Xrays  Right foot   Impression: Small increase in the fifth metatarsal head slightly hypertrophic, joint space narrowing midfoot and first MPJ consistent with arthritis with bone spurring.  Assessment and Plan: Problem List Items Addressed This Visit   None Visit Diagnoses     Capsulitis    -  Primary   Relevant Medications   triamcinolone acetonide (KENALOG) 10 MG/ML injection 10 mg (Completed) (Start on 11/19/2020  1:00 PM)   Other Relevant Orders  DG Foot Complete Right   Right foot pain       Dry skin       Tailor's bunion of right foot             -Complete examination performed -Xrays reviewed -Discussed treatment options for likely capsulitis secondary to mechanical gait/tailor's bunion -After oral consent and aseptic prep, injected a mixture containing 1 ml of 2%  plain lidocaine, 1 ml 0.5% plain marcaine, 0.5 ml of kenalog 10 and 0.5 ml of dexamethasone phosphate  into right fifth MPJ without complication. Post-injection care discussed with patient.  -Dispensed tailor bunion sleeve to use as directed until he can afford or get over-the-counter insoles for his shoes -Recommend if pain continues topical pain cream or rub over-the-counter -Encouraged daily skin emollients for dry skin -Patient to return to office as needed or sooner if condition worsens.  Asencion Islam, DPM

## 2020-11-19 NOTE — Patient Instructions (Signed)
OTC topical voltaren gel to rub on for pain as needed  For tennis shoes recommend:  Anne [employer]utter [employer]is New balance Saucony Can be purchased at [employer] or [employer] arch fit Can be purchased at any major retailers  Vionic  SAS Can be purchased at [employer] or [employer]   For work shoes recommend: [employer] Can be purchased at a variety of places or Scientist, product/process development   For casual shoes recommend: Vionic  Can be purchased at [employer] or [employer]   For Over the [employer] recommend: [employer] Can be purchased in office/[facility] [employer] Can be purchased at [employer] or [employer] Can be purchased at [employer]

## 2021-03-07 DIAGNOSIS — I2510 Atherosclerotic heart disease of native coronary artery without angina pectoris: Secondary | ICD-10-CM | POA: Diagnosis not present

## 2021-03-07 DIAGNOSIS — I10 Essential (primary) hypertension: Secondary | ICD-10-CM | POA: Diagnosis not present

## 2021-03-07 DIAGNOSIS — Z951 Presence of aortocoronary bypass graft: Secondary | ICD-10-CM | POA: Diagnosis not present

## 2021-03-07 DIAGNOSIS — E785 Hyperlipidemia, unspecified: Secondary | ICD-10-CM | POA: Diagnosis not present

## 2021-06-10 DIAGNOSIS — Z20822 Contact with and (suspected) exposure to covid-19: Secondary | ICD-10-CM | POA: Diagnosis not present

## 2021-06-16 DIAGNOSIS — Z6827 Body mass index (BMI) 27.0-27.9, adult: Secondary | ICD-10-CM | POA: Diagnosis not present

## 2021-06-16 DIAGNOSIS — I119 Hypertensive heart disease without heart failure: Secondary | ICD-10-CM | POA: Diagnosis not present

## 2021-06-16 DIAGNOSIS — Z79899 Other long term (current) drug therapy: Secondary | ICD-10-CM | POA: Diagnosis not present

## 2021-06-16 DIAGNOSIS — Z125 Encounter for screening for malignant neoplasm of prostate: Secondary | ICD-10-CM | POA: Diagnosis not present

## 2021-06-16 DIAGNOSIS — E7800 Pure hypercholesterolemia, unspecified: Secondary | ICD-10-CM | POA: Diagnosis not present

## 2021-09-01 DIAGNOSIS — R0981 Nasal congestion: Secondary | ICD-10-CM | POA: Diagnosis not present

## 2021-09-01 DIAGNOSIS — J069 Acute upper respiratory infection, unspecified: Secondary | ICD-10-CM | POA: Diagnosis not present

## 2021-10-10 DIAGNOSIS — I10 Essential (primary) hypertension: Secondary | ICD-10-CM | POA: Diagnosis not present

## 2021-10-10 DIAGNOSIS — E785 Hyperlipidemia, unspecified: Secondary | ICD-10-CM | POA: Diagnosis not present

## 2021-10-10 DIAGNOSIS — I2510 Atherosclerotic heart disease of native coronary artery without angina pectoris: Secondary | ICD-10-CM | POA: Diagnosis not present

## 2021-10-10 DIAGNOSIS — Z951 Presence of aortocoronary bypass graft: Secondary | ICD-10-CM | POA: Diagnosis not present

## 2021-12-17 DIAGNOSIS — Z0100 Encounter for examination of eyes and vision without abnormal findings: Secondary | ICD-10-CM | POA: Diagnosis not present

## 2022-01-27 DIAGNOSIS — Z6826 Body mass index (BMI) 26.0-26.9, adult: Secondary | ICD-10-CM | POA: Diagnosis not present

## 2022-01-27 DIAGNOSIS — Z0000 Encounter for general adult medical examination without abnormal findings: Secondary | ICD-10-CM | POA: Diagnosis not present

## 2022-01-27 DIAGNOSIS — Z1331 Encounter for screening for depression: Secondary | ICD-10-CM | POA: Diagnosis not present

## 2022-01-27 DIAGNOSIS — Z79899 Other long term (current) drug therapy: Secondary | ICD-10-CM | POA: Diagnosis not present

## 2022-01-27 DIAGNOSIS — E7800 Pure hypercholesterolemia, unspecified: Secondary | ICD-10-CM | POA: Diagnosis not present

## 2022-03-18 DIAGNOSIS — R1012 Left upper quadrant pain: Secondary | ICD-10-CM | POA: Diagnosis not present

## 2022-03-18 DIAGNOSIS — R0781 Pleurodynia: Secondary | ICD-10-CM | POA: Diagnosis not present

## 2022-03-18 DIAGNOSIS — Z6825 Body mass index (BMI) 25.0-25.9, adult: Secondary | ICD-10-CM | POA: Diagnosis not present

## 2022-03-18 DIAGNOSIS — L309 Dermatitis, unspecified: Secondary | ICD-10-CM | POA: Diagnosis not present

## 2022-04-06 DIAGNOSIS — R1012 Left upper quadrant pain: Secondary | ICD-10-CM | POA: Diagnosis not present

## 2022-04-06 DIAGNOSIS — I700 Atherosclerosis of aorta: Secondary | ICD-10-CM | POA: Diagnosis not present

## 2022-04-06 DIAGNOSIS — R109 Unspecified abdominal pain: Secondary | ICD-10-CM | POA: Diagnosis not present

## 2022-04-08 DIAGNOSIS — B352 Tinea manuum: Secondary | ICD-10-CM | POA: Diagnosis not present

## 2022-04-08 DIAGNOSIS — B351 Tinea unguium: Secondary | ICD-10-CM | POA: Diagnosis not present

## 2022-04-20 DIAGNOSIS — E785 Hyperlipidemia, unspecified: Secondary | ICD-10-CM | POA: Diagnosis not present

## 2022-04-20 DIAGNOSIS — I10 Essential (primary) hypertension: Secondary | ICD-10-CM | POA: Diagnosis not present

## 2022-04-20 DIAGNOSIS — I2510 Atherosclerotic heart disease of native coronary artery without angina pectoris: Secondary | ICD-10-CM | POA: Diagnosis not present

## 2022-04-20 DIAGNOSIS — Z951 Presence of aortocoronary bypass graft: Secondary | ICD-10-CM | POA: Diagnosis not present

## 2022-05-01 DIAGNOSIS — R0981 Nasal congestion: Secondary | ICD-10-CM | POA: Diagnosis not present

## 2022-05-01 DIAGNOSIS — R051 Acute cough: Secondary | ICD-10-CM | POA: Diagnosis not present

## 2022-06-26 DIAGNOSIS — L259 Unspecified contact dermatitis, unspecified cause: Secondary | ICD-10-CM | POA: Diagnosis not present

## 2022-07-04 DIAGNOSIS — Z20822 Contact with and (suspected) exposure to covid-19: Secondary | ICD-10-CM | POA: Diagnosis not present

## 2022-07-04 DIAGNOSIS — U071 COVID-19: Secondary | ICD-10-CM | POA: Diagnosis not present

## 2022-07-04 DIAGNOSIS — J069 Acute upper respiratory infection, unspecified: Secondary | ICD-10-CM | POA: Diagnosis not present

## 2022-07-20 DIAGNOSIS — Z79899 Other long term (current) drug therapy: Secondary | ICD-10-CM | POA: Diagnosis not present

## 2022-07-20 DIAGNOSIS — Z6825 Body mass index (BMI) 25.0-25.9, adult: Secondary | ICD-10-CM | POA: Diagnosis not present

## 2022-07-20 DIAGNOSIS — E7800 Pure hypercholesterolemia, unspecified: Secondary | ICD-10-CM | POA: Diagnosis not present

## 2022-07-20 DIAGNOSIS — Z125 Encounter for screening for malignant neoplasm of prostate: Secondary | ICD-10-CM | POA: Diagnosis not present

## 2022-07-20 DIAGNOSIS — I119 Hypertensive heart disease without heart failure: Secondary | ICD-10-CM | POA: Diagnosis not present

## 2022-08-03 DIAGNOSIS — H6991 Unspecified Eustachian tube disorder, right ear: Secondary | ICD-10-CM | POA: Diagnosis not present

## 2022-08-03 DIAGNOSIS — J00 Acute nasopharyngitis [common cold]: Secondary | ICD-10-CM | POA: Diagnosis not present

## 2022-08-17 DIAGNOSIS — Z6825 Body mass index (BMI) 25.0-25.9, adult: Secondary | ICD-10-CM | POA: Diagnosis not present

## 2022-08-17 DIAGNOSIS — H9201 Otalgia, right ear: Secondary | ICD-10-CM | POA: Diagnosis not present

## 2022-08-17 DIAGNOSIS — R42 Dizziness and giddiness: Secondary | ICD-10-CM | POA: Diagnosis not present

## 2022-09-22 DIAGNOSIS — H6991 Unspecified Eustachian tube disorder, right ear: Secondary | ICD-10-CM | POA: Diagnosis not present

## 2022-09-22 DIAGNOSIS — H9209 Otalgia, unspecified ear: Secondary | ICD-10-CM | POA: Diagnosis not present

## 2022-12-01 DIAGNOSIS — H2703 Aphakia, bilateral: Secondary | ICD-10-CM | POA: Diagnosis not present

## 2022-12-14 DIAGNOSIS — I10 Essential (primary) hypertension: Secondary | ICD-10-CM | POA: Diagnosis not present

## 2022-12-14 DIAGNOSIS — Z951 Presence of aortocoronary bypass graft: Secondary | ICD-10-CM | POA: Diagnosis not present

## 2022-12-14 DIAGNOSIS — E785 Hyperlipidemia, unspecified: Secondary | ICD-10-CM | POA: Diagnosis not present

## 2022-12-14 DIAGNOSIS — I2510 Atherosclerotic heart disease of native coronary artery without angina pectoris: Secondary | ICD-10-CM | POA: Diagnosis not present

## 2022-12-25 DIAGNOSIS — H26491 Other secondary cataract, right eye: Secondary | ICD-10-CM | POA: Diagnosis not present

## 2022-12-25 DIAGNOSIS — H26492 Other secondary cataract, left eye: Secondary | ICD-10-CM | POA: Diagnosis not present

## 2022-12-25 DIAGNOSIS — H18513 Endothelial corneal dystrophy, bilateral: Secondary | ICD-10-CM | POA: Diagnosis not present

## 2023-01-29 DIAGNOSIS — D485 Neoplasm of uncertain behavior of skin: Secondary | ICD-10-CM | POA: Diagnosis not present

## 2023-01-29 DIAGNOSIS — L219 Seborrheic dermatitis, unspecified: Secondary | ICD-10-CM | POA: Diagnosis not present

## 2023-02-04 DIAGNOSIS — M320 Drug-induced systemic lupus erythematosus: Secondary | ICD-10-CM | POA: Diagnosis not present

## 2023-03-25 DIAGNOSIS — Z0100 Encounter for examination of eyes and vision without abnormal findings: Secondary | ICD-10-CM | POA: Diagnosis not present

## 2023-04-02 DIAGNOSIS — L931 Subacute cutaneous lupus erythematosus: Secondary | ICD-10-CM | POA: Diagnosis not present

## 2023-06-04 DIAGNOSIS — R590 Localized enlarged lymph nodes: Secondary | ICD-10-CM | POA: Diagnosis not present

## 2023-06-04 DIAGNOSIS — M898X5 Other specified disorders of bone, thigh: Secondary | ICD-10-CM | POA: Diagnosis not present

## 2023-06-04 DIAGNOSIS — Z6826 Body mass index (BMI) 26.0-26.9, adult: Secondary | ICD-10-CM | POA: Diagnosis not present

## 2023-06-09 DIAGNOSIS — M79651 Pain in right thigh: Secondary | ICD-10-CM | POA: Diagnosis not present

## 2023-06-16 DIAGNOSIS — R590 Localized enlarged lymph nodes: Secondary | ICD-10-CM | POA: Diagnosis not present

## 2023-06-28 ENCOUNTER — Other Ambulatory Visit (HOSPITAL_COMMUNITY)
Admission: RE | Admit: 2023-06-28 | Discharge: 2023-06-28 | Disposition: A | Discharge status: Home / Self Care | Payer: Self-pay | Source: Ambulatory Visit | Attending: Surgery | Admitting: Surgery

## 2023-06-28 DIAGNOSIS — I252 Old myocardial infarction: Secondary | ICD-10-CM | POA: Diagnosis not present

## 2023-06-28 DIAGNOSIS — E7800 Pure hypercholesterolemia, unspecified: Secondary | ICD-10-CM | POA: Diagnosis not present

## 2023-06-28 DIAGNOSIS — Z87891 Personal history of nicotine dependence: Secondary | ICD-10-CM | POA: Diagnosis not present

## 2023-06-28 DIAGNOSIS — R590 Localized enlarged lymph nodes: Secondary | ICD-10-CM | POA: Diagnosis not present

## 2023-06-28 DIAGNOSIS — R221 Localized swelling, mass and lump, neck: Secondary | ICD-10-CM | POA: Diagnosis not present

## 2023-06-28 DIAGNOSIS — M1990 Unspecified osteoarthritis, unspecified site: Secondary | ICD-10-CM | POA: Diagnosis not present

## 2023-06-28 DIAGNOSIS — Z79899 Other long term (current) drug therapy: Secondary | ICD-10-CM | POA: Diagnosis not present

## 2023-06-28 DIAGNOSIS — I10 Essential (primary) hypertension: Secondary | ICD-10-CM | POA: Diagnosis not present

## 2023-06-28 DIAGNOSIS — I2510 Atherosclerotic heart disease of native coronary artery without angina pectoris: Secondary | ICD-10-CM | POA: Diagnosis not present

## 2023-07-02 LAB — SURGICAL PATHOLOGY

## 2023-07-05 DIAGNOSIS — L853 Xerosis cutis: Secondary | ICD-10-CM | POA: Diagnosis not present

## 2023-07-05 DIAGNOSIS — L931 Subacute cutaneous lupus erythematosus: Secondary | ICD-10-CM | POA: Diagnosis not present

## 2023-09-16 DIAGNOSIS — L300 Nummular dermatitis: Secondary | ICD-10-CM | POA: Diagnosis not present

## 2023-09-16 DIAGNOSIS — L931 Subacute cutaneous lupus erythematosus: Secondary | ICD-10-CM | POA: Diagnosis not present

## 2023-09-30 DIAGNOSIS — L300 Nummular dermatitis: Secondary | ICD-10-CM | POA: Diagnosis not present

## 2023-11-15 ENCOUNTER — Encounter: Payer: Self-pay | Admitting: Podiatry

## 2023-11-15 ENCOUNTER — Ambulatory Visit (INDEPENDENT_AMBULATORY_CARE_PROVIDER_SITE_OTHER): Admitting: Podiatry

## 2023-11-15 DIAGNOSIS — L84 Corns and callosities: Secondary | ICD-10-CM

## 2023-11-15 DIAGNOSIS — G629 Polyneuropathy, unspecified: Secondary | ICD-10-CM | POA: Diagnosis not present

## 2023-11-15 DIAGNOSIS — M21621 Bunionette of right foot: Secondary | ICD-10-CM

## 2023-11-15 NOTE — Progress Notes (Unsigned)
  Subjective:  Patient ID: Logan Johnson, male    DOB: 02-Mar-1946,  MRN: [id number]  Chief Complaint  Patient presents with   Callouses    Callous/corn/plantar wart. Right lateral side times 2. Looks and feels  like there is a hard center. Soaking. Not diabetic and takes ASA     78 y.o. male presents with the above complaint. History confirmed with patient.  Patient presenting with painful skin lesions x 2 under the right subfifth metatarsal head. Patient does not have a history of T2DM.  He does endorse history of neuropathy, numbness and tingling in the feet and history of CAD.  Objective:  Physical Exam: warm, good capillary refill, decreased pedal hair growth, pedal skin atrophic nail exam normal nails without lesions DP pulses palpable, PT pulses faintly palpable, and vibratory sensation diminished, protective sensation diminished, reports subjective paresthesias Left Foot: No symptomatic pedal deformity Right Foot: Painful hyperkeratotic skin lesion right subfifth metatarsal head x 2.  Prominent metatarsal head.  Some tailor's bunion deformity noted.  Assessment:   1. Callus   2. Tailor's bunion of right foot   3. Neuropathy      Plan:  Patient was evaluated and treated and all questions answered.  #Hyperkeratotic lesions/pre ulcerative calluses present x 2 subright fifth metatarsal head All symptomatic hyperkeratoses x 2 separate lesions were safely debrided as a courtesy with a sterile #312 blade to patient's level of comfort without incident. We discussed preventative and palliative care of these lesions including supportive and accommodative shoegear, padding, prefabricated and custom molded accommodative orthoses, use of a pumice stone and lotions/creams daily. - Will likely need ABN going forward -Does have neuropathy  # Tailor's bunion with prominent fifth metatarsal head right foot -Discussed foot morphology in the process of performing painful callus. - Discussed  accommodative padding and shoe gear modification - Dancers pad dispensed today. - Did briefly discuss surgical treatment if unable to offload and adequately    Return if symptoms worsen or fail to improve, for Callus.         Eve Hinders, DPM [facility] / [facility]

## 2023-11-15 NOTE — Patient Instructions (Signed)
 Look for urea  40% cream or ointment and apply to the thickened dry skin / calluses. This can be bought over the counter, at a pharmacy or online such as [employer].  You can also scrub these lesions with white vinegar using a toothbrush.  These methods will make it easier for you to file them down using a pumice stone or emery board.  More silicone and felt pads can be purchased from:  [email or web address]  Look for horseshoe pads.  You can find these on [employer], [employer], [employer].com or even some drug stores

## 2023-12-13 DIAGNOSIS — E785 Hyperlipidemia, unspecified: Secondary | ICD-10-CM | POA: Diagnosis not present

## 2023-12-13 DIAGNOSIS — I2510 Atherosclerotic heart disease of native coronary artery without angina pectoris: Secondary | ICD-10-CM | POA: Diagnosis not present

## 2023-12-13 DIAGNOSIS — I10 Essential (primary) hypertension: Secondary | ICD-10-CM | POA: Diagnosis not present

## 2023-12-13 DIAGNOSIS — Z951 Presence of aortocoronary bypass graft: Secondary | ICD-10-CM | POA: Diagnosis not present

## 2024-01-05 DIAGNOSIS — L570 Actinic keratosis: Secondary | ICD-10-CM | POA: Diagnosis not present

## 2024-01-05 DIAGNOSIS — L931 Subacute cutaneous lupus erythematosus: Secondary | ICD-10-CM | POA: Diagnosis not present

## 2024-03-02 DIAGNOSIS — E7800 Pure hypercholesterolemia, unspecified: Secondary | ICD-10-CM | POA: Diagnosis not present

## 2024-03-02 DIAGNOSIS — Z0000 Encounter for general adult medical examination without abnormal findings: Secondary | ICD-10-CM | POA: Diagnosis not present

## 2024-03-02 DIAGNOSIS — J302 Other seasonal allergic rhinitis: Secondary | ICD-10-CM | POA: Diagnosis not present

## 2024-03-02 DIAGNOSIS — G5793 Unspecified mononeuropathy of bilateral lower limbs: Secondary | ICD-10-CM | POA: Diagnosis not present

## 2024-03-02 DIAGNOSIS — Z6826 Body mass index (BMI) 26.0-26.9, adult: Secondary | ICD-10-CM | POA: Diagnosis not present

## 2024-03-02 DIAGNOSIS — Z1331 Encounter for screening for depression: Secondary | ICD-10-CM | POA: Diagnosis not present

## 2024-03-02 DIAGNOSIS — I119 Hypertensive heart disease without heart failure: Secondary | ICD-10-CM | POA: Diagnosis not present

## 2024-03-02 DIAGNOSIS — Z125 Encounter for screening for malignant neoplasm of prostate: Secondary | ICD-10-CM | POA: Diagnosis not present

## 2024-03-02 DIAGNOSIS — Z1339 Encounter for screening examination for other mental health and behavioral disorders: Secondary | ICD-10-CM | POA: Diagnosis not present
